# Patient Record
Sex: Female | Born: 1960 | Race: White | Hispanic: No | Marital: Married | Smoking: Never smoker
Health system: Southern US, Community
[De-identification: ages and names within clinical notes are randomized; demographics above are authoritative.]

---

## 2015-06-20 ENCOUNTER — Ambulatory Visit (INDEPENDENT_AMBULATORY_CARE_PROVIDER_SITE_OTHER): Payer: Worker's Compensation

## 2015-06-20 ENCOUNTER — Ambulatory Visit
Admission: EM | Admit: 2015-06-20 | Discharge: 2015-06-20 | Disposition: A | Discharge status: Home / Self Care | Payer: Worker's Compensation | Attending: Family Medicine | Admitting: Family Medicine

## 2015-06-20 ENCOUNTER — Encounter: Payer: Self-pay | Admitting: *Deleted

## 2015-06-20 DIAGNOSIS — S8392XA Sprain of unspecified site of left knee, initial encounter: Secondary | ICD-10-CM | POA: Diagnosis not present

## 2015-06-20 DIAGNOSIS — S8002XA Contusion of left knee, initial encounter: Secondary | ICD-10-CM

## 2015-06-20 MED ORDER — HYDROCODONE-ACETAMINOPHEN 5-325 MG PO TABS: ORAL_TABLET | ORAL | Status: AC

## 2015-06-20 NOTE — ED Notes (Signed)
Patient injured left knee this AM at 0830 while at work. Patient slipped and fell inside of a walk-in cooler.

## 2015-06-20 NOTE — ED Provider Notes (Signed)
CSN: [id number]     Arrival date & time 06/20/15  7829 History   First MD Initiated Contact with Patient 06/20/15 1128     Chief Complaint  Patient presents with  . Knee Injury   (Consider location/radiation/quality/duration/timing/severity/associated sxs/prior Treatment) HPI Comments: 55 yo female with a c/o left knee pain after falling at work this morning. States she slipped on wet floor while at work, twisting the left knee and landing on it on the floor. Denies hitting her head.   The history is provided by the patient.    History reviewed. No pertinent past medical history. History reviewed. No pertinent past surgical history. History reviewed. No pertinent family history. Social History  Substance Use Topics  . Smoking status: Never Smoker   . Smokeless tobacco: Never Used  . Alcohol Use: Yes   OB History    No data available     Review of Systems  Allergies  Review of patient's allergies indicates no known allergies.  Home Medications   Prior to Admission medications   Medication Sig Start Date End Date Taking? Authorizing Provider  HYDROcodone-acetaminophen (NORCO/VICODIN) 5-325 MG tablet 1-2 tabs po q 8 hours prn 06/20/15   Payton Mccallum, MD   Meds Ordered and Administered this Visit  Medications - No data to display  BP 102/77 mmHg  Pulse 84  Temp(Src) 97.7 F (36.5 C) (Oral)  Resp 18  Ht  (1.6 m)  Wt 130 lb (58.968 kg)  BMI 23.03 kg/m2  SpO2 100%  LMP  No data found.   Physical Exam  Constitutional: She appears well-developed and well-nourished. No distress.  Musculoskeletal:       Left knee: She exhibits bony tenderness. She exhibits normal range of motion, no swelling, no effusion, no ecchymosis, no deformity, no laceration, no erythema, normal alignment, no LCL laxity, normal patellar mobility and no MCL laxity. Tenderness found. Medial joint line tenderness noted. No lateral joint line, no MCL, no LCL and no patellar tendon tenderness noted.   Neurological: She is alert.  Skin: She is not diaphoretic.  Nursing note and vitals reviewed.   ED Course  Procedures (including critical care time)  Labs Review Labs Reviewed - No data to display  Imaging Review Dg Knee Complete 4 Views Left  06/20/2015  CLINICAL DATA:  Left knee pain secondary to a twisting injury this morning. EXAM: LEFT KNEE - COMPLETE 4+ VIEW COMPARISON:  None. FINDINGS: There is no evidence of fracture, dislocation, or joint effusion. Tiny osteophyte on the upper pole patella. Soft tissues are unremarkable. IMPRESSION: No significant abnormality. Electronically Signed   By: Francene Boyers M.D.   On: 06/20/2015 12:01     Visual Acuity Review  Right Eye Distance:   Left Eye Distance:   Bilateral Distance:    Right Eye Near:   Left Eye Near:    Bilateral Near:         MDM   1. Left knee sprain, initial encounter   2. Knee contusion, left, initial encounter    Discharge Medication List as of 06/20/2015 12:26 PM    START taking these medications   Details  HYDROcodone-acetaminophen (NORCO/VICODIN) 5-325 MG tablet 1-2 tabs po q 8 hours prn, Print       1. x-ray results and diagnosis reviewed with patient 2. rx as per orders above; reviewed possible side effects, interactions, risks and benefits  3. Recommend supportive treatment with rest, ice, elevation 4. May return to work tomorrow 5. [facility] prn if symptoms worsen or don't improve  Payton Mccallumrlando Vivian Neuwirth, MD 06/20/15 [phone]

## 2015-06-20 NOTE — Discharge Instructions (Signed)

## 2016-10-02 IMAGING — CR DG KNEE COMPLETE 4+V*L*
4 series · 4 of 4 positions shown · non-contrast
Comparison: None.

CLINICAL DATA: Left knee pain secondary to a twisting injury this
morning.

EXAM:
LEFT KNEE - COMPLETE 4+ VIEW

[knee ap]
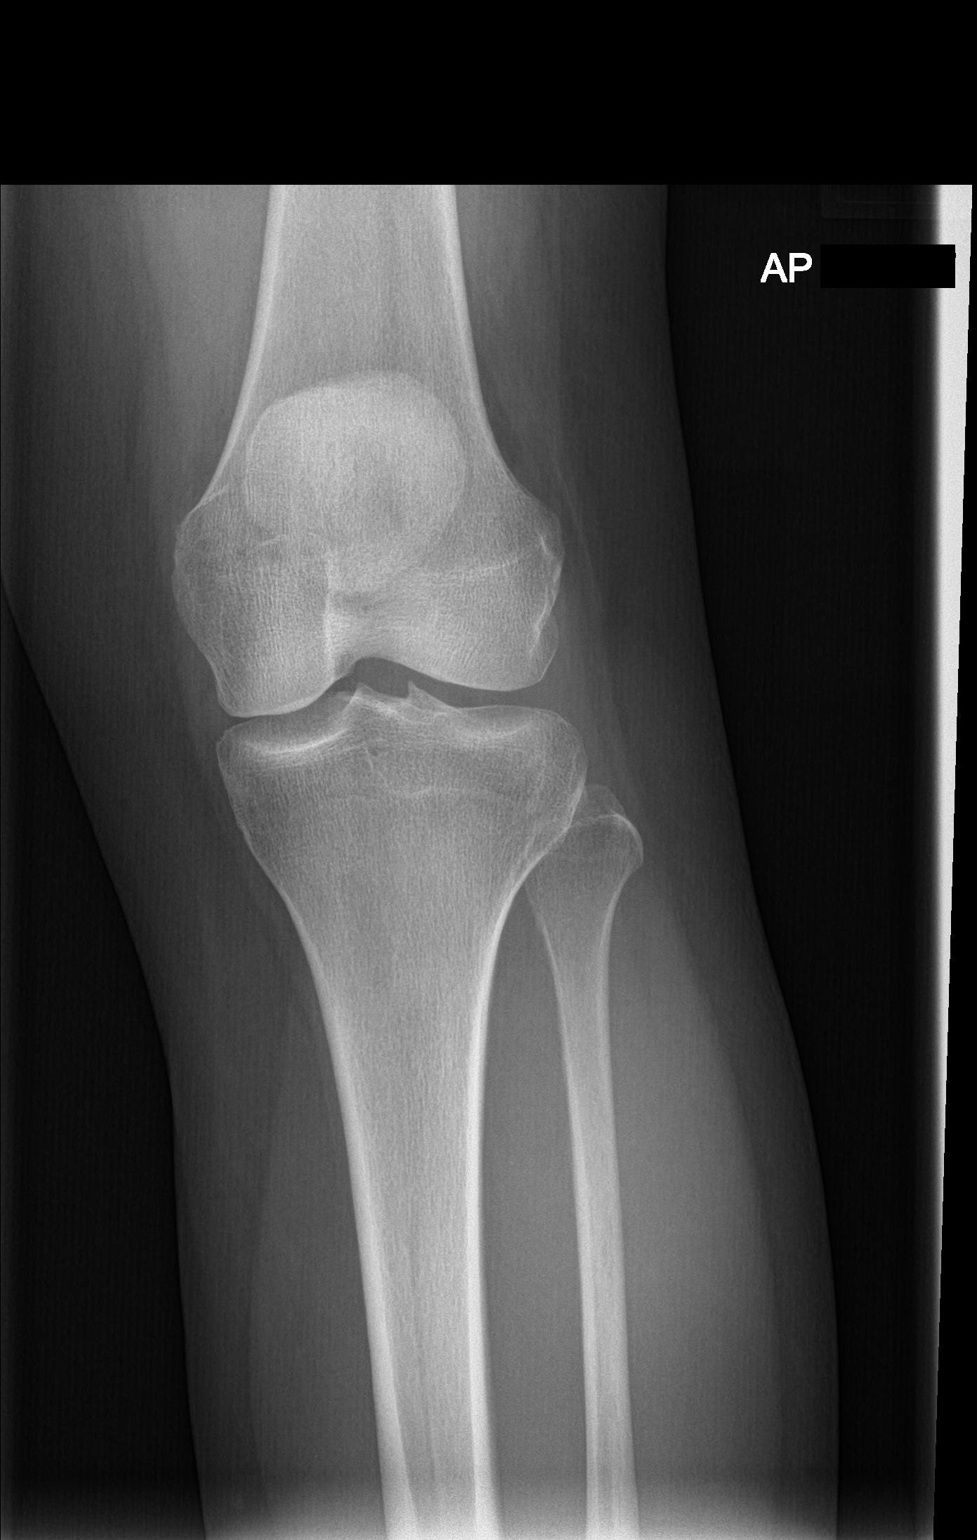

[tunnel]
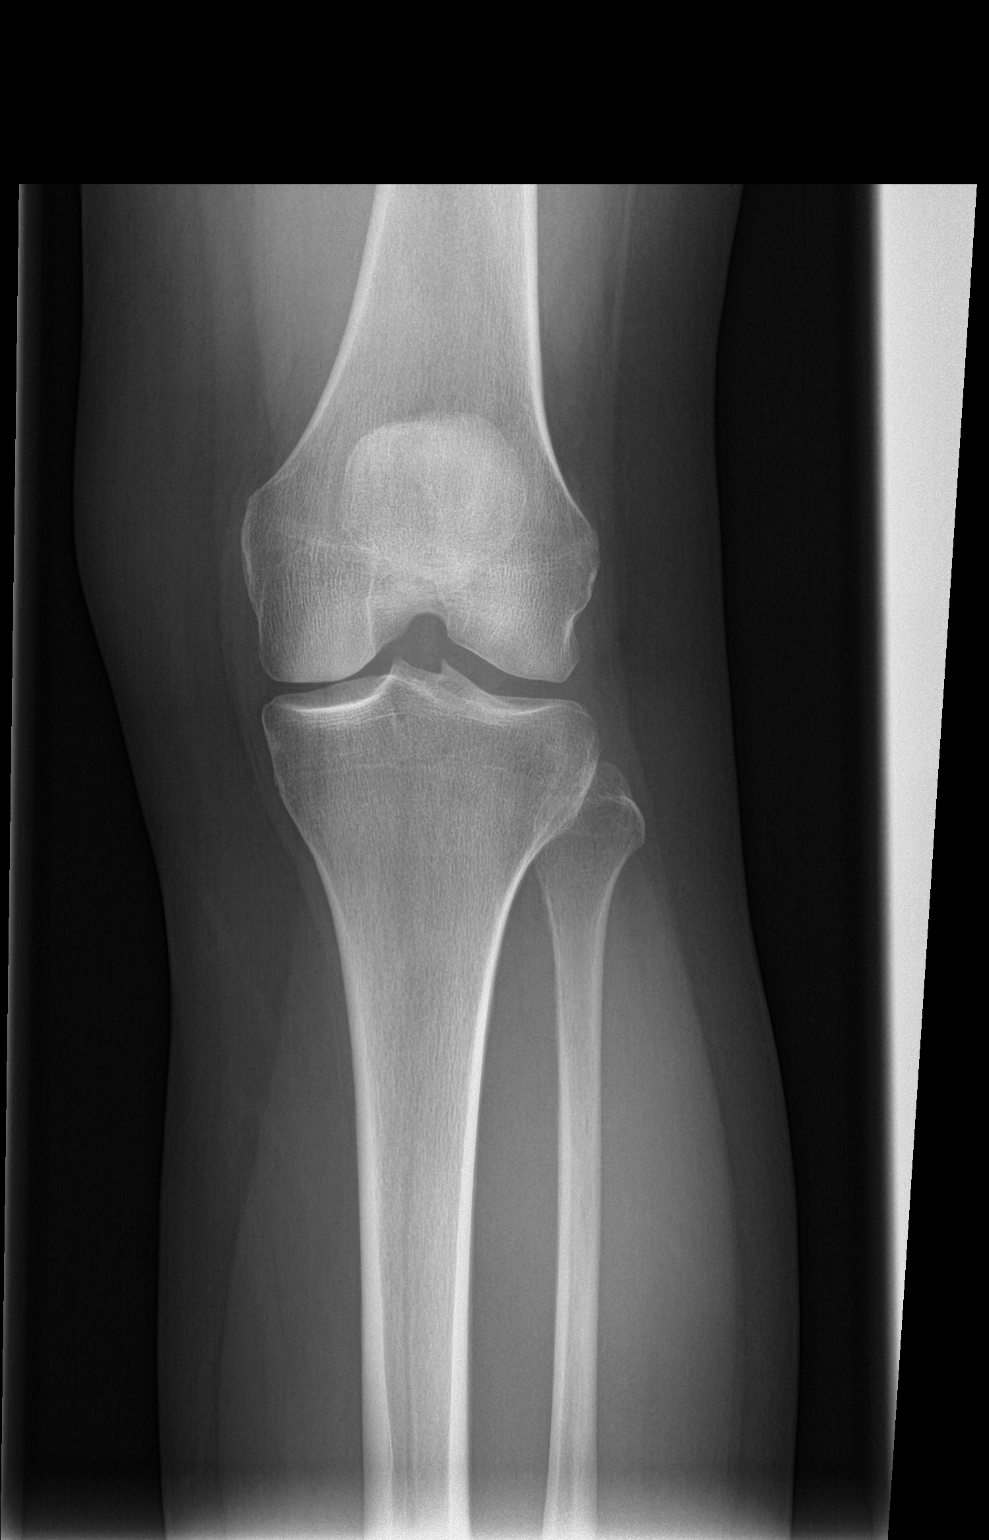

[knee lat]
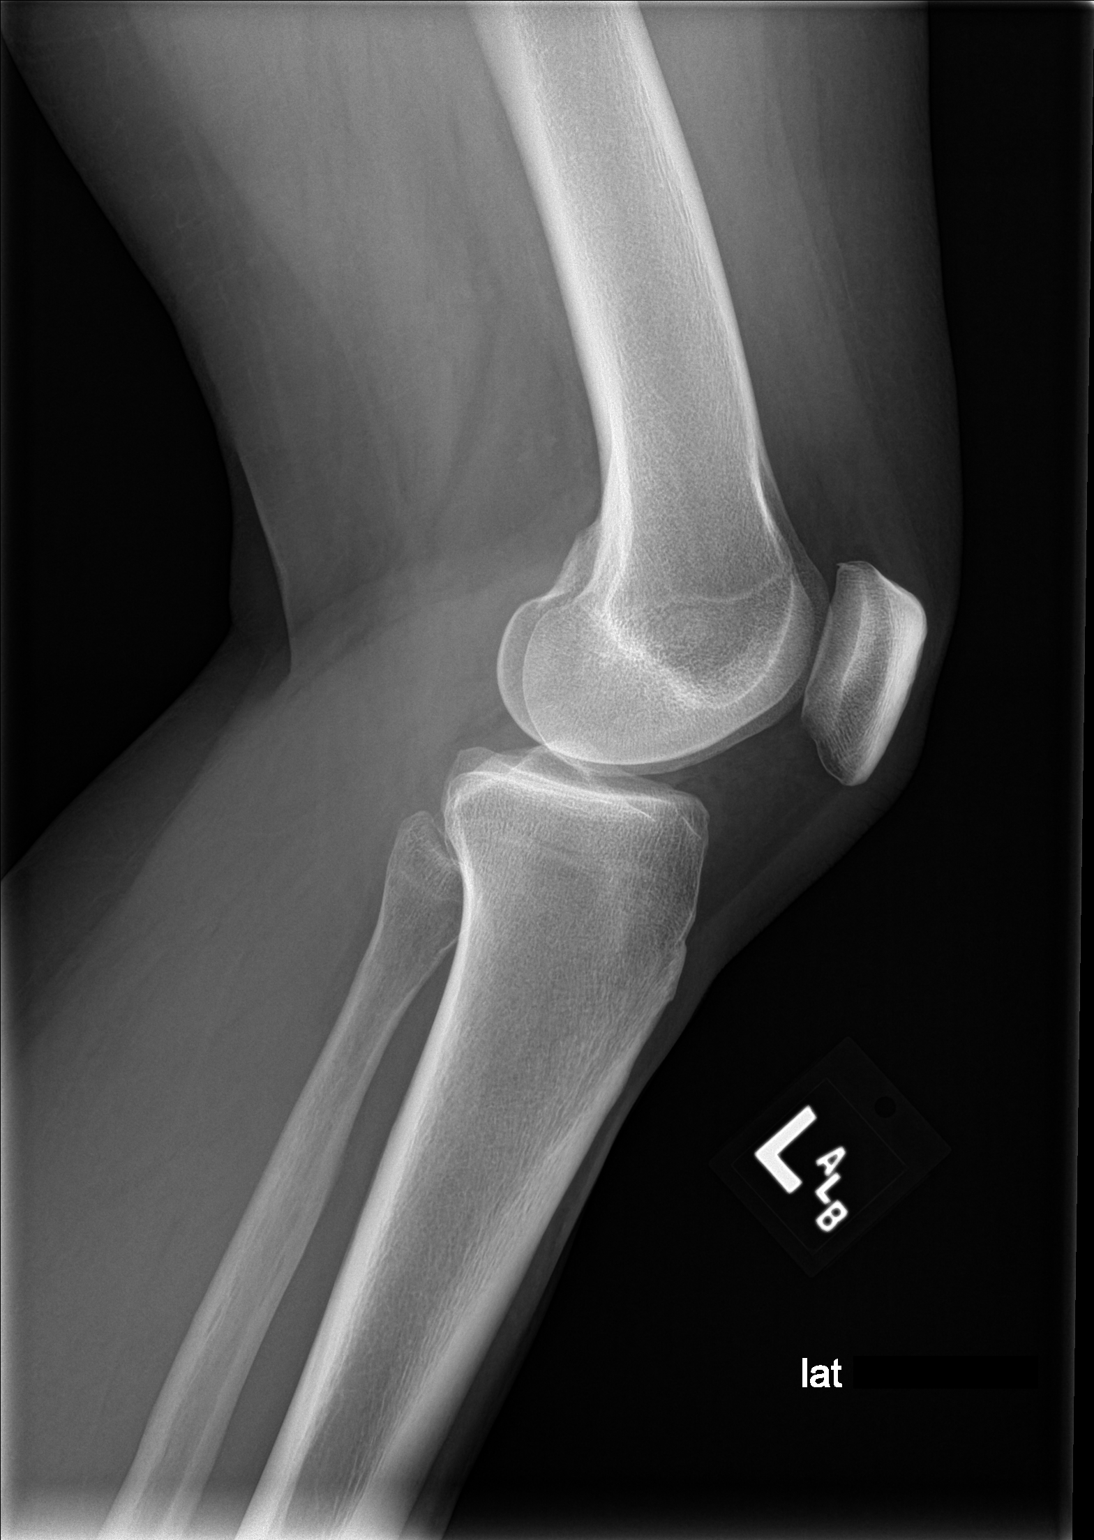

[patella skyline]
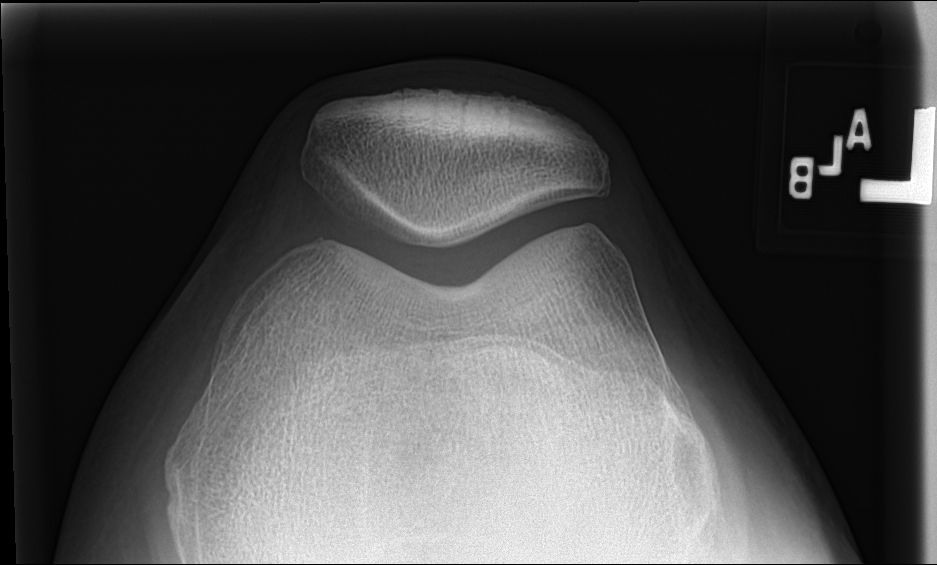

[4 of 4 positions shown; findings below may reference images not displayed]

FINDINGS: There is no evidence of fracture, dislocation, or joint effusion.
Tiny osteophyte on the upper pole patella. Soft tissues are
unremarkable.
IMPRESSION: No significant abnormality.
